# Patient Record
Sex: Female | Born: 1995 | Race: White | Hispanic: No | Marital: Single | Smoking: Never smoker
Health system: Southern US, Community
[De-identification: ages and names within clinical notes are randomized; demographics above are authoritative.]

---

## 2018-12-24 ENCOUNTER — Emergency Department (HOSPITAL_BASED_OUTPATIENT_CLINIC_OR_DEPARTMENT_OTHER): Payer: BLUE CROSS/BLUE SHIELD

## 2018-12-24 ENCOUNTER — Emergency Department (HOSPITAL_BASED_OUTPATIENT_CLINIC_OR_DEPARTMENT_OTHER)
Admission: EM | Admit: 2018-12-24 | Discharge: 2018-12-24 | Disposition: A | Discharge status: Home / Self Care | Payer: BLUE CROSS/BLUE SHIELD | Attending: Emergency Medicine | Admitting: Emergency Medicine

## 2018-12-24 ENCOUNTER — Encounter (HOSPITAL_BASED_OUTPATIENT_CLINIC_OR_DEPARTMENT_OTHER): Payer: Self-pay

## 2018-12-24 ENCOUNTER — Other Ambulatory Visit: Payer: Self-pay

## 2018-12-24 DIAGNOSIS — R109 Unspecified abdominal pain: Secondary | ICD-10-CM | POA: Diagnosis present

## 2018-12-24 DIAGNOSIS — R1031 Right lower quadrant pain: Secondary | ICD-10-CM | POA: Insufficient documentation

## 2018-12-24 DIAGNOSIS — N938 Other specified abnormal uterine and vaginal bleeding: Secondary | ICD-10-CM | POA: Insufficient documentation

## 2018-12-24 LAB — WET PREP, GENITAL
Clue Cells Wet Prep HPF POC: NONE SEEN
Sperm: NONE SEEN
Trich, Wet Prep: NONE SEEN
Yeast Wet Prep HPF POC: NONE SEEN

## 2018-12-24 LAB — COMPREHENSIVE METABOLIC PANEL
ALT: 11 U/L (ref 0–44)
AST: 16 U/L (ref 15–41)
Albumin: 4.3 g/dL (ref 3.5–5.0)
Alkaline Phosphatase: 52 U/L (ref 38–126)
Anion gap: 10 (ref 5–15)
BUN: 17 mg/dL (ref 6–20)
CO2: 21 mmol/L — ABNORMAL LOW (ref 22–32)
Calcium: 9 mg/dL (ref 8.9–10.3)
Chloride: 107 mmol/L (ref 98–111)
Creatinine, Ser: 0.77 mg/dL (ref 0.44–1.00)
GFR calc Af Amer: >60 mL/min (ref >60)
GFR calc non Af Amer: >60 mL/min (ref >60)
Glucose, Bld: 93 mg/dL (ref 70–99)
Potassium: 4.3 mmol/L (ref 3.5–5.1)
Sodium: 138 mmol/L (ref 135–145)
Total Bilirubin: 1.3 mg/dL — ABNORMAL HIGH (ref 0.3–1.2)
Total Protein: 7.3 g/dL (ref 6.5–8.1)

## 2018-12-24 LAB — HIV ANTIBODY (ROUTINE TESTING W REFLEX): HIV Screen 4th Generation wRfx: NONREACTIVE

## 2018-12-24 LAB — URINALYSIS, ROUTINE W REFLEX MICROSCOPIC
Bilirubin Urine: NEGATIVE
Glucose, UA: NEGATIVE mg/dL
Ketones, ur: NEGATIVE mg/dL
Leukocytes,Ua: NEGATIVE
Nitrite: NEGATIVE
Protein, ur: 30 mg/dL — AB
Specific Gravity, Urine: >1.03 — ABNORMAL HIGH (ref 1.005–1.030)
pH: 5.5 (ref 5.0–8.0)

## 2018-12-24 LAB — URINALYSIS, MICROSCOPIC (REFLEX)

## 2018-12-24 LAB — CBC WITH DIFFERENTIAL/PLATELET
Abs Immature Granulocytes: 0.04 10*3/uL (ref 0.00–0.07)
Basophils Absolute: 0 10*3/uL (ref 0.0–0.1)
Basophils Relative: 0 %
Eosinophils Absolute: 0 10*3/uL (ref 0.0–0.5)
Eosinophils Relative: 0 %
HCT: 40.1 % (ref 36.0–46.0)
Hemoglobin: 13.4 g/dL (ref 12.0–15.0)
Immature Granulocytes: 0 %
Lymphocytes Relative: 22 %
Lymphs Abs: 2.1 10*3/uL (ref 0.7–4.0)
MCH: 30.4 pg (ref 26.0–34.0)
MCHC: 33.4 g/dL (ref 30.0–36.0)
MCV: 90.9 fL (ref 80.0–100.0)
Monocytes Absolute: 0.7 10*3/uL (ref 0.1–1.0)
Monocytes Relative: 8 %
Neutro Abs: 6.8 10*3/uL (ref 1.7–7.7)
Neutrophils Relative %: 70 %
Platelets: 306 10*3/uL (ref 150–400)
RBC: 4.41 MIL/uL (ref 3.87–5.11)
RDW: 12.9 % (ref 11.5–15.5)
WBC: 9.8 10*3/uL (ref 4.0–10.5)
nRBC: 0 % (ref 0.0–0.2)

## 2018-12-24 LAB — PREGNANCY, URINE: Preg Test, Ur: NEGATIVE

## 2018-12-24 LAB — LIPASE, BLOOD: Lipase: 39 U/L (ref 11–51)

## 2018-12-24 MED ORDER — IOHEXOL 300 MG/ML  SOLN
100.0000 mL | Freq: Once | INTRAMUSCULAR | Status: AC | PRN
  Administered 2018-12-24: 17:00:00 100 mL via INTRAVENOUS

## 2018-12-24 MED ORDER — SODIUM CHLORIDE 0.9 % IV BOLUS
1000.0000 mL | Freq: Once | INTRAVENOUS | Status: AC
  Administered 2018-12-24: 1000 mL via INTRAVENOUS

## 2018-12-24 NOTE — ED Triage Notes (Signed)
Pt c/o right side abd pain started last night-denies n/v/d, denise urinary sx and vaginal-NAD-steady gait

## 2018-12-24 NOTE — ED Notes (Signed)
Patient transported to Ultrasound 

## 2018-12-24 NOTE — Discharge Instructions (Signed)
Your labwork, ultrasound, and CT scan were all reassuring today We have tested you for gonorrhea, chlamydia, HIV, and syphilis - you will receive a call in 2-3 days IF you test positive. You will not receive a call if you are negative.  Please follow up with OBGYN given abdominal pain and vaginal bleeding - [facility]. They have walk in hours - please call to find out when their walk in hours are You can take 600 mg Ibuprofen every 6-8 hours as needed for your pain

## 2018-12-24 NOTE — ED Provider Notes (Signed)
[facility] DEPARTMENT Provider Note   CSN: [id number] Arrival date & time: 12/24/18  1150     History   Chief Complaint Chief Complaint  Patient presents with   Abdominal Pain    HPI Katherine Estrada is a 23 y.o. female who presents to the ED with mother complaining of gradual onset, intermittent, achy, RLQ abdominal pain that began last night.  Patient reports the pain lasts a few minutes before subsiding on its own.  She also reports that she had her period 2 weeks ago and finished approximately 1 week ago.  She states that she noticed some spotting last night as well which is atypical for her.  Patient has never had pain like this in the past.  She has not taken anything for it as it typically goes away on its own.  He is sexually active with one female partner but reports they always use protection.  Denies fever, chills, nausea, vomiting, diarrhea, pelvic pain, vaginal discharge, dysuria, urinary frequency, urgency, any other associated symptoms.  No previous abdominal surgeries.  No recent antibiotic use.  No suspicious food intake.      History reviewed. No pertinent past medical history.  There are no active problems to display for this patient.   History reviewed. No pertinent surgical history.   OB History    Gravida  0   Para  0   Term  0   Preterm  0   AB  0   Living  0     SAB  0   TAB  0   Ectopic  0   Multiple  0   Live Births  0            Home Medications    Prior to Admission medications   Not on File    Family History No family history on file.  Social History Social History   Tobacco Use   Smoking status: Never Smoker   Smokeless tobacco: Never Used  Substance Use Topics   Alcohol use: Never    Frequency: Never   Drug use: Never     Allergies   Patient has no known allergies.   Review of Systems Review of Systems  Constitutional: Negative for chills and fever.  HENT: Negative for  congestion.   Eyes: Negative for visual disturbance.  Respiratory: Negative for shortness of breath.   Cardiovascular: Negative for chest pain.  Gastrointestinal: Positive for abdominal pain. Negative for diarrhea, nausea and vomiting.  Genitourinary: Positive for menstrual problem. Negative for difficulty urinating, dysuria, flank pain, pelvic pain and vaginal discharge.  Musculoskeletal: Negative for myalgias.  Skin: Negative for rash.  Neurological: Negative for headaches.     Physical Exam Updated Vital Signs BP (!) 119/91 (BP Location: Left Arm)    Pulse 98    Temp 97.7 F (36.5 C) (Oral)    Resp 20    Ht 5\' 6"  (1.676 m)    Wt 44.3 kg    LMP 12/13/2018    SpO2 100%    BMI 15.77 kg/m   Physical Exam Vitals signs and nursing note reviewed.  Constitutional:      Appearance: She is not ill-appearing.  HENT:     Head: Normocephalic and atraumatic.  Eyes:     Conjunctiva/sclera: Conjunctivae normal.  Neck:     Musculoskeletal: Neck supple.  Cardiovascular:     Rate and Rhythm: Normal rate and regular rhythm.  Pulmonary:     Effort: Pulmonary effort is  normal.     Breath sounds: Normal breath sounds. No wheezing, rhonchi or rales.  Abdominal:     Palpations: Abdomen is soft.     Tenderness: There is abdominal tenderness in the right lower quadrant. There is no right CVA tenderness, left CVA tenderness, guarding or rebound. Positive signs include McBurney's sign. Negative signs include Rovsing's sign, psoas sign and obturator sign.  Genitourinary:    Comments: Chaperone present for exam. No rashes, lesions, or tenderness to external genitalia. No erythema, injury, or tenderness to vaginal mucosa. Small amount of blood to vault. No adnexal masses, tenderness, or fullness. No CMT, cervical friability, or discharge from cervical os. Cervical os is closed. Uterus non-deviated, mobile, nonTTP, and without enlargement.  Skin:    General: Skin is warm and dry.  Neurological:      Mental Status: She is alert.      ED Treatments / Results  Labs (all labs ordered are listed, but only abnormal results are displayed) Labs Reviewed  WET PREP, GENITAL - Abnormal; Notable for the following components:      Result Value   WBC, Wet Prep HPF POC MODERATE (*)    All other components within normal limits  COMPREHENSIVE METABOLIC PANEL - Abnormal; Notable for the following components:   CO2 21 (*)    Total Bilirubin 1.3 (*)    All other components within normal limits  URINALYSIS, ROUTINE W REFLEX MICROSCOPIC - Abnormal; Notable for the following components:   APPearance CLOUDY (*)    Specific Gravity, Urine >1.030 (*)    Hgb urine dipstick LARGE (*)    Protein, ur 30 (*)    All other components within normal limits  URINALYSIS, MICROSCOPIC (REFLEX) - Abnormal; Notable for the following components:   Bacteria, UA FEW (*)    All other components within normal limits  LIPASE, BLOOD  CBC WITH DIFFERENTIAL/PLATELET  PREGNANCY, URINE  RPR  HIV ANTIBODY (ROUTINE TESTING W REFLEX)  GC/CHLAMYDIA PROBE AMP ([place]) NOT AT [facility]    EKG None  Radiology Ct Abdomen Pelvis W Contrast  Result Date: 12/24/2018 CLINICAL DATA:  Abdomen pain.  Assess for appendicitis. EXAM: CT ABDOMEN AND PELVIS WITH CONTRAST TECHNIQUE: Multidetector CT imaging of the abdomen and pelvis was performed using the standard protocol following bolus administration of intravenous contrast. CONTRAST:  OMNIPAQUE IOHEXOL 300 MG/ML  SOLN COMPARISON:  None. FINDINGS: Lower chest: No acute abnormality. Hepatobiliary: No focal liver abnormality is seen. No gallstones, gallbladder wall thickening, or biliary dilatation. Pancreas: Unremarkable. No pancreatic ductal dilatation or surrounding inflammatory changes. Spleen: Normal in size without focal abnormality. Adrenals/Urinary Tract: Adrenal glands are unremarkable. Kidneys are normal, without renal calculi, focal lesion, or hydronephrosis. Bladder is  unremarkable. Stomach/Bowel: Stomach is within normal limits. Appendix appears normal. No evidence of bowel wall thickening, distention, or inflammatory changes. Vascular/Lymphatic: No significant vascular findings are present. No enlarged abdominal or pelvic lymph nodes. Reproductive: The uterus is normal. Bilateral ovarian cysts are identified, right greater than left. Small amount of free fluid is identified in the right adnexa. Other: None. Musculoskeletal: No acute abnormality. IMPRESSION: No bowel obstruction.  Normal appendix. Bilateral ovarian cysts, right greater than left. Small amount of free fluid is identified in the right adnexa, physiologic. Electronically Signed   By: Sherian Rein M.D.   On: 12/24/2018 17:34   US Pelvic Complete W Transvaginal And Torsion R/o  Result Date: 12/24/2018 CLINICAL DATA:  Right lower quadrant abdomen pain EXAM: TRANSABDOMINAL AND TRANSVAGINAL ULTRASOUND OF PELVIS DOPPLER  ULTRASOUND OF OVARIES TECHNIQUE: Both transabdominal and transvaginal ultrasound examinations of the pelvis were performed. Transabdominal technique was performed for global imaging of the pelvis including uterus, ovaries, adnexal regions, and pelvic cul-de-sac. It was necessary to proceed with endovaginal exam following the transabdominal exam to visualize the endometrium and bilateral ovaries. Color and duplex Doppler ultrasound was utilized to evaluate blood flow to the ovaries. COMPARISON:  None. FINDINGS: Uterus Measurements: 6.5 x 3.3 x 4.3 cm = volume: 47.2 mL. No fibroids or other mass visualized. Endometrium Thickness: 4.8 mm.  No focal abnormality visualized. Right ovary Measurements: 4.7 x 1.9 x 4.8 cm = volume: 22.2 mL. Normal appearance/no adnexal mass. Left ovary Measurements: 3.5 x 2.2 x 3.6 cm = volume: 14.6 mL. Normal appearance/no adnexal mass. Pulsed Doppler evaluation of both ovaries demonstrates normal low-resistance arterial and venous waveforms. Other findings Trace free fluid  is identified in the pelvis. IMPRESSION: No acute abnormality.  No evidence of ovarian torsion. Trace free fluid is identified in the pelvis which may be physiologic. Electronically Signed   By: Sherian ReinWei-Chen  Lin M.D.   On: 12/24/2018 14:58    Procedures Procedures (including critical care time)  Medications Ordered in ED Medications  sodium chloride 0.9 % bolus 1,000 mL (0 mLs Intravenous Stopped 12/24/18 1609)  iohexol (OMNIPAQUE) 300 MG/ML solution 100 mL (100 mLs Intravenous Contrast Given 12/24/18 1707)     Initial Impression / Assessment and Plan / ED Course  I have reviewed the triage vital signs and the nursing notes.  Pertinent labs & imaging results that were available during my care of the patient were reviewed by me and considered in my medical decision making (see chart for details).    23 year old female who is sexually active with 1 female partner who presents to the ED today with right lower quadrant intermittent abdominal pain since last night.  No nausea, vomiting, diarrhea.  Patient afebrile in the ED without tachycardia or tachypnea.  No previous abdominal surgeries.  There is some concern for appendicitis given intermittent abdominal pain without other constitutional symptoms more concern for ovarian torsion at this time.  Will obtain pelvic ultrasound.  Baseline screening labs also obtained including urine pregnancy test.  If pregnancy test positive will order ultrasound to rule out ectopic pregnancy.  If negative will order Doppler to look for ovarian torsion.  If no acute findings on ultrasound will consider CT abdomen and pelvis given right lower quadrant abdominal pain although patient without any other signs including Rovsing's, psoas, obturator.  She states that she does not need anything for pain currently.  We will continue to monitor.   No Leukocytosis on CBC.  Hemoglobin is stable.  No electrolyte abnormalities.  Lipase negative.  No elevation in LFTs.  Is not pregnant.   Urinalysis does show increased specific gravity with 21-50 red blood cells per high-power field.  Patient appears dehydrated.  Will order 1 L normal saline bolus.   Pelvic exam without any CMT or adnexal tenderness.  Ultrasound negative for torsion.  Considering no findings on ultrasound we will proceed to CT abdomen pelvis today to rule out appendicitis.   CT scan negative for appendicitis. No other acute findings. Pt to be discharged home at this time - advised close follow up with OBGYN. Mom reports that they are in the process of scheduling an appointment with an OBGYN in the area but unsure when they can be seen. Will give information for [facility] healthcare to follow up with as well. Strict return  precautions have been discussed. Advised 600 mg Ibuprofen PRN for pain. Pt is in agreement with plan at this time and stable for discharge home.   This note was prepared using Dragon voice recognition software and may include unintentional dictation errors due to the inherent limitations of voice recognition software.       Final Clinical Impressions(s) / ED Diagnoses   Final diagnoses:  Right lower quadrant abdominal pain  Dysfunctional uterine bleeding    ED Discharge Orders    None       Tanda Rockers, PA-C 12/24/18 1753    Vanetta Mulders, MD 01/02/19 [phone]

## 2018-12-25 LAB — RPR: RPR Ser Ql: NONREACTIVE

## 2018-12-26 LAB — GC/CHLAMYDIA PROBE AMP (~~LOC~~) NOT AT ARMC
Chlamydia: NEGATIVE
Neisseria Gonorrhea: NEGATIVE

## 2021-06-09 ENCOUNTER — Emergency Department (HOSPITAL_BASED_OUTPATIENT_CLINIC_OR_DEPARTMENT_OTHER): Payer: BLUE CROSS/BLUE SHIELD

## 2021-06-09 ENCOUNTER — Emergency Department (HOSPITAL_BASED_OUTPATIENT_CLINIC_OR_DEPARTMENT_OTHER)
Admission: EM | Admit: 2021-06-09 | Discharge: 2021-06-09 | Disposition: A | Discharge status: Home / Self Care | Payer: BLUE CROSS/BLUE SHIELD | Attending: Emergency Medicine | Admitting: Emergency Medicine

## 2021-06-09 ENCOUNTER — Other Ambulatory Visit: Payer: Self-pay

## 2021-06-09 DIAGNOSIS — R29810 Facial weakness: Secondary | ICD-10-CM | POA: Insufficient documentation

## 2021-06-09 DIAGNOSIS — R200 Anesthesia of skin: Secondary | ICD-10-CM

## 2021-06-09 LAB — COMPREHENSIVE METABOLIC PANEL
ALT: 9 U/L (ref 0–44)
AST: 12 U/L — ABNORMAL LOW (ref 15–41)
Albumin: 4.4 g/dL (ref 3.5–5.0)
Alkaline Phosphatase: 32 U/L — ABNORMAL LOW (ref 38–126)
Anion gap: 8 (ref 5–15)
BUN: 10 mg/dL (ref 6–20)
CO2: 26 mmol/L (ref 22–32)
Calcium: 9.7 mg/dL (ref 8.9–10.3)
Chloride: 105 mmol/L (ref 98–111)
Creatinine, Ser: 0.73 mg/dL (ref 0.44–1.00)
GFR, Estimated: >60 mL/min (ref >60)
Glucose, Bld: 98 mg/dL (ref 70–99)
Potassium: 4.3 mmol/L (ref 3.5–5.1)
Sodium: 139 mmol/L (ref 135–145)
Total Bilirubin: 0.5 mg/dL (ref 0.3–1.2)
Total Protein: 7.2 g/dL (ref 6.5–8.1)

## 2021-06-09 LAB — CBC WITH DIFFERENTIAL/PLATELET
Abs Immature Granulocytes: 0.03 10*3/uL (ref 0.00–0.07)
Basophils Absolute: 0 10*3/uL (ref 0.0–0.1)
Basophils Relative: 0 %
Eosinophils Absolute: 0.1 10*3/uL (ref 0.0–0.5)
Eosinophils Relative: 1 %
HCT: 36.1 % (ref 36.0–46.0)
Hemoglobin: 12.5 g/dL (ref 12.0–15.0)
Immature Granulocytes: 0 %
Lymphocytes Relative: 30 %
Lymphs Abs: 2.9 10*3/uL (ref 0.7–4.0)
MCH: 30.6 pg (ref 26.0–34.0)
MCHC: 34.6 g/dL (ref 30.0–36.0)
MCV: 88.3 fL (ref 80.0–100.0)
Monocytes Absolute: 0.7 10*3/uL (ref 0.1–1.0)
Monocytes Relative: 7 %
Neutro Abs: 5.8 10*3/uL (ref 1.7–7.7)
Neutrophils Relative %: 62 %
Platelets: 292 10*3/uL (ref 150–400)
RBC: 4.09 MIL/uL (ref 3.87–5.11)
RDW: 12.3 % (ref 11.5–15.5)
WBC: 9.5 10*3/uL (ref 4.0–10.5)
nRBC: 0 % (ref 0.0–0.2)

## 2021-06-09 MED ORDER — PREDNISONE 20 MG PO TABS: ORAL_TABLET | ORAL | 0 refills | Status: DC

## 2021-06-09 MED ORDER — PREDNISONE 50 MG PO TABS
60.0000 mg | ORAL_TABLET | Freq: Once | ORAL | Status: AC
  Administered 2021-06-09: 60 mg via ORAL
  Filled 2021-06-09: qty 1

## 2021-06-09 NOTE — ED Provider Notes (Signed)
?[facility] EMERGENC[facility] DEPT ?Provider Note ? ? ?CSN: [id number] ?Arrival date & time: 06/09/21  1840 ? ?  ? ?History ? ?Chief Complaint  ?Patient presents with  ? Numbness  ?  L Face, Neck  ? ? ?Katherine Estrada is a 26 y.o. female otherwise healthy here presenting with left facial numbness.  Patient noticed decreased sensation in her left face that goes down to the left side of her neck since yesterday.  Denies any blurry vision or trouble speaking or focal weakness.  Patient denies any arm or leg numbness or weakness.  Denies any medical problems.  ? ?The history is provided by the patient.  ? ?  ? ?Home Medications ?Prior to Admission medications   ?Not on File  ?   ? ?Allergies    ?Patient has no known allergies.   ? ?Review of Systems   ?Review of Systems  ?Neurological:  Positive for numbness.  ?All other systems reviewed and are negative. ? ?Physical Exam ?Updated Vital Signs ?BP 128/81 (BP Location: Right Arm)   Pulse 75   Temp 98.1 ?F (36.7 ?C) (Oral)   Resp 18   Ht 5\' 5"  (1.651 m)   Wt 45.4 kg   SpO2 100%   BMI 16.64 kg/m?  ?Physical Exam ?Vitals and nursing note reviewed.  ?Constitutional:   ?   Appearance: Normal appearance.  ?HENT:  ?   Head: Normocephalic.  ?   Nose: Nose normal.  ?   Mouth/Throat:  ?   Mouth: Mucous membranes are moist.  ?Eyes:  ?   Extraocular Movements: Extraocular movements intact.  ?   Pupils: Pupils are equal, round, and reactive to light.  ?Cardiovascular:  ?   Rate and Rhythm: Normal rate and regular rhythm.  ?   Pulses: Normal pulses.  ?   Heart sounds: Normal heart sounds.  ?Pulmonary:  ?   Effort: Pulmonary effort is normal.  ?   Breath sounds: Normal breath sounds.  ?Abdominal:  ?   General: Abdomen is flat.  ?   Palpations: Abdomen is soft.  ?Musculoskeletal:     ?   General: Normal range of motion.  ?   Cervical back: Normal range of motion and neck supple.  ?Skin: ?   General: Skin is warm.  ?   Capillary Refill: Capillary refill takes less than 2  seconds.  ?Neurological:  ?   Mental Status: She is alert.  ?   Comments: Decreased sensation of the entire left face.  Patient has no obvious facial droop.  Patient is able to open and close her eyes.  Patient has no numbness of the arms and legs.  Patient has normal finger-to-nose bilaterally and negative Romberg sign.  Normal strength upper and lower extremities.  Patient has normal gait   ?Psychiatric:     ?   Mood and Affect: Mood normal.     ?   Behavior: Behavior normal.  ? ? ?ED Results / Procedures / Treatments   ?Labs ?(all labs ordered are listed, but only abnormal results are displayed) ?Labs Reviewed  ?COMPREHENSIVE METABOLIC PANEL - Abnormal; Notable for the following components:  ?    Result Value  ? AST 12 (*)   ? Alkaline Phosphatase 32 (*)   ? All other components within normal limits  ?CBC WITH DIFFERENTIAL/PLATELET  ? ? ?EKG ?None ? ?Radiology ?CT Head Wo Contrast ? ?Result Date: 06/09/2021 ?CLINICAL DATA:  Persistent left-sided facial numbness. EXAM: CT HEAD WITHOUT CONTRAST TECHNIQUE: Contiguous axial  images were obtained from the base of the skull through the vertex without intravenous contrast. RADIATION DOSE REDUCTION: This exam was performed according to the departmental dose-optimization program which includes automated exposure control, adjustment of the mA and/or kV according to patient size and/or use of iterative reconstruction technique. COMPARISON:  None. FINDINGS: Brain: No evidence of acute infarction, hemorrhage, hydrocephalus, extra-axial collection or mass lesion/mass effect. Vascular: No hyperdense vessel or unexpected calcification. Skull: Normal. Negative for fracture or focal lesion. Sinuses/Orbits: No acute finding. Other: None. IMPRESSION: No acute intracranial pathology. Electronically Signed   By: Aram Candela M.D.   On: 06/09/2021 22:31   ? ?Procedures ?Procedures  ? ? ?Medications Ordered in ED ?Medications  ?predniSONE (DELTASONE) tablet 60 mg (60 mg Oral Given  06/09/21 2248)  ? ? ?ED Course/ Medical Decision Making/ A&P ?  ?                        ?Medical Decision Making ?Katherine Estrada is a 26 y.o. female here with left facial numbness.  I think likely early Bell's palsy.  Patient does not have any obvious facial droop and no obvious facial muscle weakness.  Also consider electrolyte abnormalities.  Finally, we will get a CT head to rule out brain mass. ? ?11:01 PM ?Labs unremarkable.  CT head showed no mass.  Will give prednisone empirically for early Bell's palsy.  Will refer to neurology outpatient ?  ? ?Problems Addressed: ?Left facial numbness: acute illness or injury ? ?Amount and/or Complexity of Data Reviewed ?Labs: ordered. Decision-making details documented in ED Course. ?Radiology: ordered and independent interpretation performed. Decision-making details documented in ED Course. ? ? ?Final Clinical Impression(s) / ED Diagnoses ?Final diagnoses:  ?None  ? ? ?Rx / DC Orders ?ED Discharge Orders   ? ? None  ? ?  ? ? ?  ?Charlynne Pander, MD ?06/09/21 2302 ? ?

## 2021-06-09 NOTE — ED Triage Notes (Signed)
Pt via POV, c/o persistent numbness to left face and neck onset yesterday 4/12 approx 7 pm.Pt ambulatory in triage. No weakness, face symmetrical, dizziness, vision changes and no other neuro deficit.  ?

## 2021-06-09 NOTE — Discharge Instructions (Addendum)
You likely have a early Bell's palsy.  Your labs and CT scans were normal today ? ?Please take prednisone as prescribed ? ?If after you finish the prednisone, you still have facial numbness please call neurology for follow-up ? ?Return to ER if you have worse numbness, weakness, trouble speaking ? ?

## 2021-06-30 ENCOUNTER — Encounter: Payer: Self-pay | Admitting: Neurology

## 2021-06-30 ENCOUNTER — Ambulatory Visit (INDEPENDENT_AMBULATORY_CARE_PROVIDER_SITE_OTHER): Payer: No Typology Code available for payment source | Admitting: Neurology

## 2021-06-30 VITALS — BP 111/70 | HR 82 | Ht 66.0 in | Wt 101.0 lb

## 2021-06-30 DIAGNOSIS — R200 Anesthesia of skin: Secondary | ICD-10-CM | POA: Diagnosis not present

## 2021-06-30 NOTE — Patient Instructions (Signed)
Continue with your PMD  ?Return as needed  ?

## 2021-06-30 NOTE — Progress Notes (Signed)
? ?[facility] ? ?PATIENT: Katherine Estrada ?DOB: Oct 13, 1995 ? ?REQUESTING CLINICIAN: Charlynne Pander, MD ?HISTORY FROM: Patient  ?REASON FOR VISIT: Left facial numbness  ? ? ?HISTORICAL ? ?CHIEF COMPLAINT:  ?Chief Complaint  ?Patient presents with  ? New Patient (Initial Visit)  ?  Rm 12. ?NP/internal ED referral for left facial numbness, early Bell's Palsy.  ? ? ?HISTORY OF PRESENT ILLNESS:  ?This is a 26 year old woman with no reported past medical history who is presenting with 3 weeks history of facial numbness.  Patient presented to the ED on April 13 after a 2-day history of left facial numbness.  The numbness involves the left forehead and the left maxillary area, lower chin and lower jaw is spared.  She had a head CT which was negative, diagnosed with early Bell's palsy and started on prednisone for 7 days.  Patient report completing the treatment, and has improvement of the numbness.  States the numbness is still present but improved.  No other symptoms, denies any facial weakness, denies any facial pain except mild left ear pain intermittent, denies any change in her taste and no other complaints. She denies any trauma and no provoking factors.  ? ? ?OTHER MEDICAL CONDITIONS: None  ? ? ?REVIEW OF SYSTEMS: Full 14 system review of systems performed and negative with exception of: as noted in the HPI  ? ?ALLERGIES: ?No Known Allergies ? ?HOME MEDICATIONS: ?Outpatient Medications Prior to Visit  ?Medication Sig Dispense Refill  ? predniSONE (DELTASONE) 20 MG tablet Take 60 mg daily x 2 days then 40 mg daily x 2 days then 20 mg daily x 2 days 12 tablet 0  ? ?No facility-administered medications prior to visit.  ? ? ?PAST MEDICAL HISTORY: ?History reviewed. No pertinent past medical history. ? ?PAST SURGICAL HISTORY: ?History reviewed. No pertinent surgical history. ? ?FAMILY HISTORY: ?History reviewed. No pertinent family history. ? ?SOCIAL HISTORY: ?Social History  ? ?Socioeconomic  History  ? Marital status: Single  ?  Spouse name: Not on file  ? Number of children: Not on file  ? Years of education: Not on file  ? Highest education level: Not on file  ?Occupational History  ? Not on file  ?Tobacco Use  ? Smoking status: Never  ? Smokeless tobacco: Never  ?Vaping Use  ? Vaping Use: Never used  ?Substance and Sexual Activity  ? Alcohol use: Never  ? Drug use: Never  ? Sexual activity: Yes  ?  Partners: Male  ?  Birth control/protection: None, Condom  ?Other Topics Concern  ? Not on file  ?Social History Narrative  ? Not on file  ? ?Social Determinants of Health  ? ?Financial Resource Strain: Not on file  ?Food Insecurity: Not on file  ?Transportation Needs: Not on file  ?Physical Activity: Not on file  ?Stress: Not on file  ?Social Connections: Not on file  ?Intimate Partner Violence: Not on file  ? ? ?PHYSICAL EXAM ? ?GENERAL EXAM/CONSTITUTIONAL: ?Vitals:  ?Vitals:  ? 06/30/21 1311  ?BP: 111/70  ?Pulse: 82  ?Weight: 101 lb (45.8 kg)  ?Height: 5\' 6"  (1.676 m)  ? ?Body mass index is 16.3 kg/m?. ?Wt Readings from Last 3 Encounters:  ?06/30/21 101 lb (45.8 kg)  ?06/09/21 100 lb (45.4 kg)  ?12/24/18 97 lb 11.2 oz (44.3 kg)  ? ?Patient is in no distress; well developed, nourished and groomed; neck is supple ? ?EYES: ?Pupils round and reactive to light, Visual fields full to confrontation, Extraocular movements intacts,  ? ?  MUSCULOSKELETAL: ?Gait, strength, tone, movements noted in Neurologic exam below ? ?NEUROLOGIC: ?MENTAL STATUS:  ?   ? View : No data to display.  ?  ?  ?  ? ?awake, alert, oriented to person, place and time ?recent and remote memory intact ?normal attention and concentration ?language fluent, comprehension intact, naming intact ?fund of knowledge appropriate ? ?CRANIAL NERVE:  ?2nd, 3rd, 4th, 6th - pupils equal and reactive to light, visual fields full to confrontation, extraocular muscles intact, no nystagmus ?5th - Decrease sensation to forehead and left maxillary area ?7th -  facial strength symmetric ?8th - hearing intact ?9th - palate elevates symmetrically, uvula midline ?11th - shoulder shrug symmetric ?12th - tongue protrusion midline ? ?MOTOR:  ?normal bulk and tone, full strength in the BUE, BLE ? ?SENSORY:  ?normal and symmetric to light touch, pinprick, temperature, vibration ? ?COORDINATION:  ?finger-nose-finger, fine finger movements normal ? ?REFLEXES:  ?deep tendon reflexes present and symmetric ? ?GAIT/STATION:  ?normal ? ? ? ?DIAGNOSTIC DATA (LABS, IMAGING, TESTING) ?- I reviewed patient records, labs, notes, testing and imaging myself where available. ? ?Lab Results  ?Component Value Date  ? WBC 9.5 06/09/2021  ? HGB 12.5 06/09/2021  ? HCT 36.1 06/09/2021  ? MCV 88.3 06/09/2021  ? PLT 292 06/09/2021  ? ?   ?Component Value Date/Time  ? NA 139 06/09/2021 2117  ? K 4.3 06/09/2021 2117  ? CL 105 06/09/2021 2117  ? CO2 26 06/09/2021 2117  ? GLUCOSE 98 06/09/2021 2117  ? BUN 10 06/09/2021 2117  ? CREATININE 0.73 06/09/2021 2117  ? CALCIUM 9.7 06/09/2021 2117  ? PROT 7.2 06/09/2021 2117  ? ALBUMIN 4.4 06/09/2021 2117  ? AST 12 (L) 06/09/2021 2117  ? ALT 9 06/09/2021 2117  ? ALKPHOS 32 (L) 06/09/2021 2117  ? BILITOT 0.5 06/09/2021 2117  ? GFRNONAA >60 06/09/2021 2117  ? GFRAA >60 12/24/2018 1252  ? ?No results found for: CHOL, HDL, LDLCALC, LDLDIRECT, TRIG, CHOLHDL ?No results found for: HGBA1C ?No results found for: VITAMINB12 ?No results found for: TSH ? ? ?Head CT 06/09/21 ?No acute intracranial pathology. ? ? ? ?ASSESSMENT AND PLAN ? ?26 y.o. year old female with no reported past medical history who is presenting with 3 weeks history of left forehead and left maxillary area numbness which is now improving.  Patient denies any weakness of the face, denies any change in her taste or speech.  She denies any inciting factors, no trauma.  This presentation is not typical for Bell's palsy as there is no weakness but there is  presence of sensory changes.  I have informed patient  that we will continue to monitor her symptoms.  If they get worse or she has any new symptoms to come to the office as soon as possible, otherwise continue to follow with your primary care doctor ? ? ?1. Left facial numbness   ? ? ? ?Patient Instructions  ?Continue with your PMD  ?Return as needed  ? ?No orders of the defined types were placed in this encounter. ? ? ?No orders of the defined types were placed in this encounter. ? ? ?Return if symptoms worsen or fail to improve. ? ?I have spent a total of 30 minutes dedicated to this patient today, preparing to see patient, performing a medically appropriate examination and evaluation, ordering tests and/or medications and procedures, and counseling and educating the patient/family/caregiver; independently interpreting result and communicating results to the family/patient/caregiver; and documenting clinical information in the electronic  medical record. ? ? ?Windell NorfolkAmadou Hser Belanger, MD 06/30/2021, 1:37 PM ? ?[facility] ?[address], [address] ?[place], [place] [address] ?[phone] ? ?

## 2022-03-13 ENCOUNTER — Telehealth: Payer: Self-pay | Admitting: Neurology

## 2022-03-13 NOTE — Telephone Encounter (Signed)
LVM and sent mychart msg informing pt of need to reschedule 06/12/22 appointment - MD out

## 2022-06-12 ENCOUNTER — Ambulatory Visit: Payer: No Typology Code available for payment source | Admitting: Neurology

## 2022-06-19 ENCOUNTER — Ambulatory Visit: Payer: No Typology Code available for payment source | Admitting: Neurology

## 2022-08-23 ENCOUNTER — Ambulatory Visit: Payer: No Typology Code available for payment source | Admitting: Neurology

## 2022-11-09 NOTE — Progress Notes (Signed)
Cardiology Office Note:  .   Date:  11/09/2022  ID:  Katherine Estrada, DOB 1995-10-28, MRN [id number] PCP: No primary care provider on file.  [facility] Providers Cardiologist:  None    History of Present Illness: Katherine Estrada is a 27 y.o. female here for palpitations.  She presents with skipped heartbeats for the past two months. The sensation of skipped beats occurs mostly when standing and is experienced daily, once or twice. She denies any episodes of heart racing, syncope, or pre-syncope. She consumes caffeine but denies excessive intake or consumption of energy drinks. No family hx of SCD. Grandfather with MI in 60s  ROS:  per HPI otherwise negative   Studies Reviewed: Marland Kitchen        EKG Interpretation Date/Time:  Friday November 10 2022 08:17:21 EDT Ventricular Rate:  90 PR Interval:  172 QRS Duration:  76 QT Interval:  326 QTC Calculation: 398 R Axis:   -27  Text Interpretation: Normal sinus rhythm Normal ECG No previous ECGs available Confirmed by Carolan Clines ([id number]) on 11/10/2022 8:21:22 AM  Risk Assessment/Calculations:        Physical Exam:   VS:   Vitals:   11/10/22 0817  BP: 110/82  Pulse: 90  SpO2: 99%    Wt Readings from Last 3 Encounters:  06/30/21 101 lb (45.8 kg)  06/09/21 100 lb (45.4 kg)  12/24/18 97 lb 11.2 oz (44.3 kg)    GEN: Well nourished, well developed in no acute distress NECK: No JVD; No carotid bruits CARDIAC: RRR, no murmurs, rubs, gallops RESPIRATORY:  Clear to auscultation without rales, wheezing or rhonchi  ABDOMEN: Soft, non-tender, non-distended EXTREMITIES:  No edema; No deformity   ASSESSMENT AND PLAN: .   Palpitations -She does not have high risk features including syncope c/f arrhythmia , family hx of SCD, or abnormalities on her EKG. -recommend cutting caffeine - no plans for further w/u at this time  Dispo:  FU PRN  Signed, Maisie Fus, MD

## 2022-11-10 ENCOUNTER — Encounter: Payer: Self-pay | Admitting: Internal Medicine

## 2022-11-10 ENCOUNTER — Ambulatory Visit: Payer: BC Managed Care – PPO | Attending: Internal Medicine | Admitting: Internal Medicine

## 2022-11-10 VITALS — BP 110/82 | HR 90 | Ht 65.0 in | Wt 103.2 lb

## 2022-11-10 DIAGNOSIS — Z136 Encounter for screening for cardiovascular disorders: Secondary | ICD-10-CM

## 2022-11-10 NOTE — Patient Instructions (Signed)
Medication Instructions:  Your physician recommends that you continue on your current medications as directed. Please refer to the Current Medication list given to you today.  *If you need a refill on your cardiac medications before your next appointment, please call your pharmacy*   Lab Work: NONE If you have labs (blood work) drawn today and your tests are completely normal, you will receive your results only by: MyChart Message (if you have MyChart) OR A paper copy in the mail If you have any lab test that is abnormal or we need to change your treatment, we will call you to review the results.   Testing/Procedures: NONE   Follow-Up: At [facility], you and your health needs are our priority.  As part of our continuing mission to provide you with exceptional heart care, we have created designated Provider Care Teams.  These Care Teams include your primary Cardiologist (physician) and Advanced Practice Providers (APPs -  Physician Assistants and Nurse Practitioners) who all work together to provide you with the care you need, when you need it.  We recommend signing up for the patient portal called "MyChart".  Sign up information is provided on this After Visit Summary.  MyChart is used to connect with patients for Virtual Visits (Telemedicine).  Patients are able to view lab/test results, encounter notes, upcoming appointments, etc.  Non-urgent messages can be sent to your provider as well.   To learn more about what you can do with MyChart, go to [email or web address].    Your next appointment:   AS NEEDED

## 2022-12-13 DIAGNOSIS — J069 Acute upper respiratory infection, unspecified: Secondary | ICD-10-CM | POA: Diagnosis not present

## 2022-12-26 ENCOUNTER — Ambulatory Visit: Payer: BC Managed Care – PPO | Admitting: Neurology

## 2023-01-20 DIAGNOSIS — N390 Urinary tract infection, site not specified: Secondary | ICD-10-CM | POA: Diagnosis not present

## 2023-01-20 DIAGNOSIS — R350 Frequency of micturition: Secondary | ICD-10-CM | POA: Diagnosis not present

## 2023-05-29 DIAGNOSIS — R350 Frequency of micturition: Secondary | ICD-10-CM | POA: Diagnosis not present

## 2023-05-29 DIAGNOSIS — R300 Dysuria: Secondary | ICD-10-CM | POA: Diagnosis not present

## 2023-06-08 DIAGNOSIS — J020 Streptococcal pharyngitis: Secondary | ICD-10-CM | POA: Diagnosis not present

## 2023-06-08 DIAGNOSIS — H9203 Otalgia, bilateral: Secondary | ICD-10-CM | POA: Diagnosis not present

## 2023-06-25 DIAGNOSIS — N390 Urinary tract infection, site not specified: Secondary | ICD-10-CM | POA: Diagnosis not present

## 2023-07-29 DIAGNOSIS — M545 Low back pain, unspecified: Secondary | ICD-10-CM | POA: Diagnosis not present

## 2023-07-29 DIAGNOSIS — R102 Pelvic and perineal pain: Secondary | ICD-10-CM | POA: Diagnosis not present

## 2023-07-29 DIAGNOSIS — N83202 Unspecified ovarian cyst, left side: Secondary | ICD-10-CM | POA: Diagnosis not present

## 2023-07-29 DIAGNOSIS — Z87440 Personal history of urinary (tract) infections: Secondary | ICD-10-CM | POA: Diagnosis not present

## 2023-07-29 DIAGNOSIS — R300 Dysuria: Secondary | ICD-10-CM | POA: Diagnosis not present

## 2023-07-29 DIAGNOSIS — N898 Other specified noninflammatory disorders of vagina: Secondary | ICD-10-CM | POA: Diagnosis not present

## 2023-07-29 DIAGNOSIS — N83201 Unspecified ovarian cyst, right side: Secondary | ICD-10-CM | POA: Diagnosis not present

## 2023-07-29 DIAGNOSIS — N83291 Other ovarian cyst, right side: Secondary | ICD-10-CM | POA: Diagnosis not present

## 2023-07-29 DIAGNOSIS — N83292 Other ovarian cyst, left side: Secondary | ICD-10-CM | POA: Diagnosis not present

## 2023-08-03 IMAGING — CT CT HEAD W/O CM
4 series · 17 of 47 positions shown, 19 images · non-contrast
Comparison: None.

CLINICAL DATA: Persistent left-sided facial numbness.



[Series 2: head wo · axial · 0.42mm/px · z∈[+906,+1026]mm · 7 of 34 slices shown, 9 images]
[im 5/34  brain]
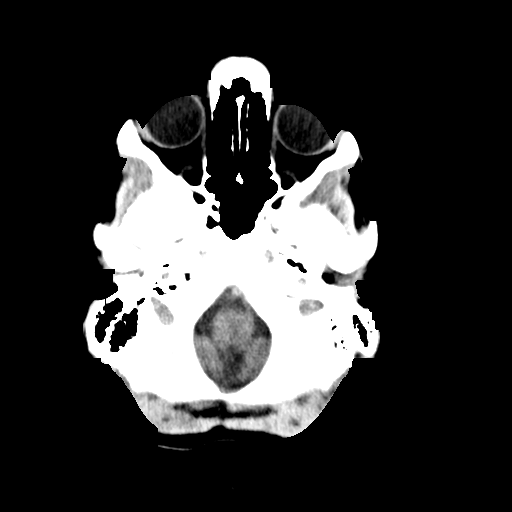
[im 5/34  bone]
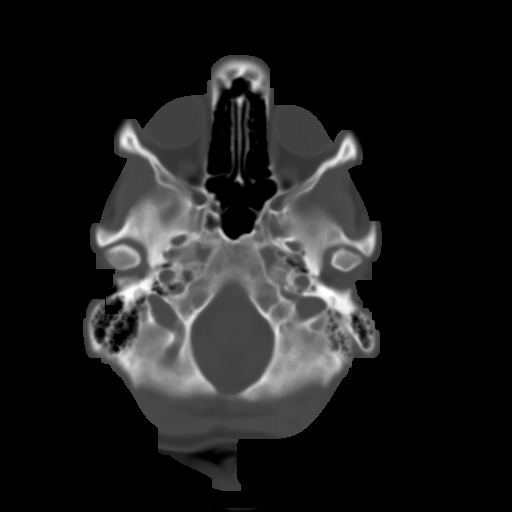
[im 9/34  brain]
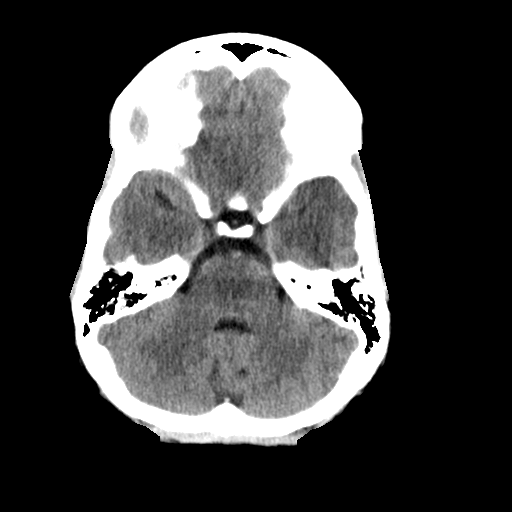
[im 13/34  brain]
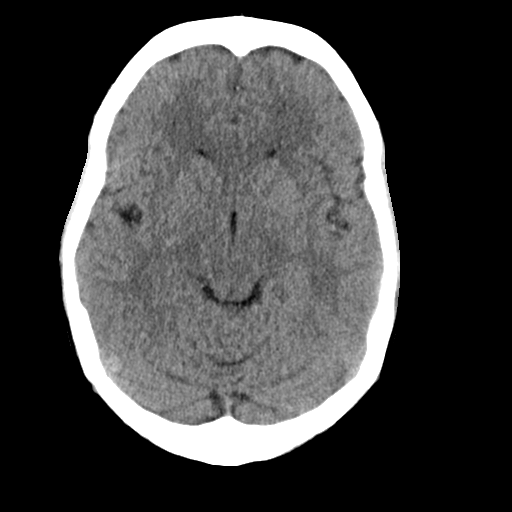
[im 17/34  brain]
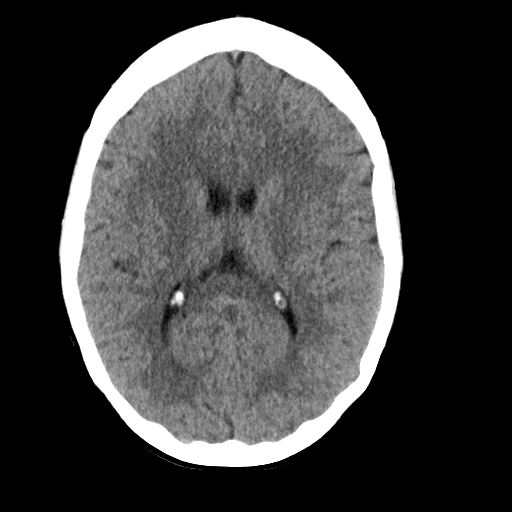
[im 21/34  brain]
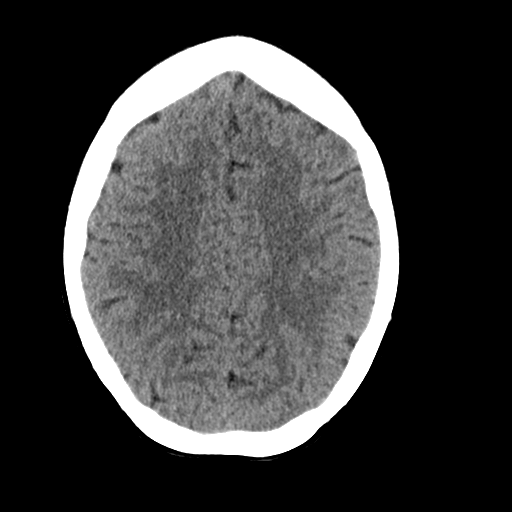
[im 21/34  bone]
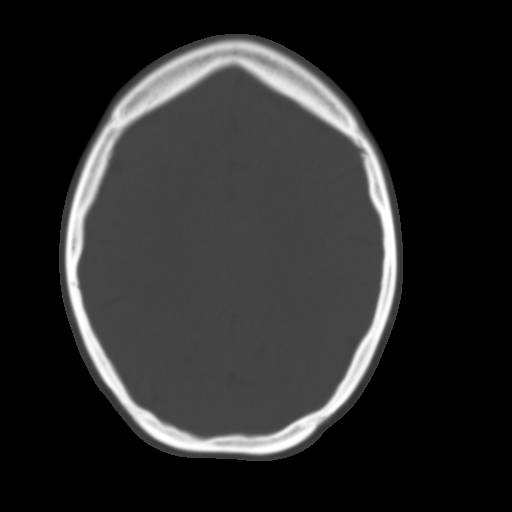
[im 25/34  brain]
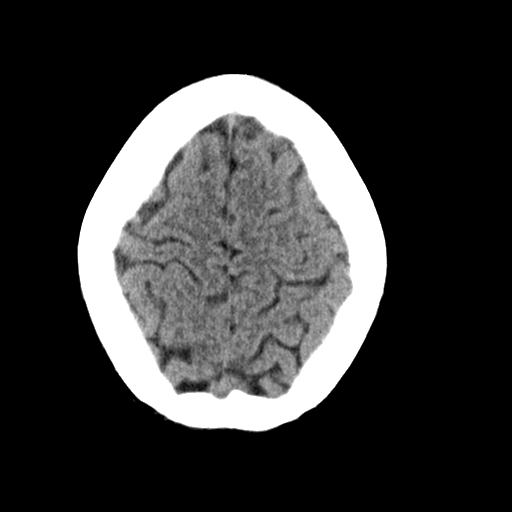
[im 29/34  brain]
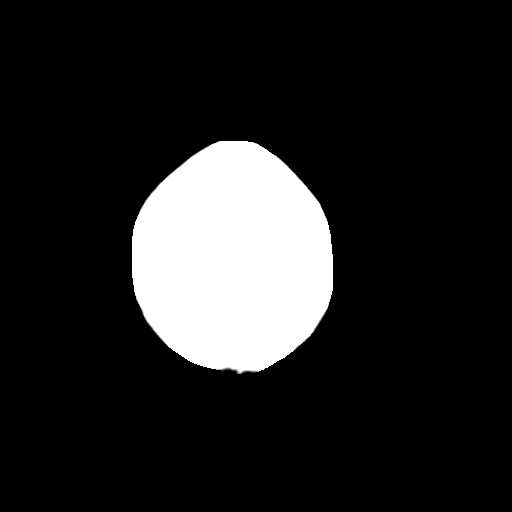

[Series 3: head bone · axial · 0.42mm/px · z∈[+902,+958]mm · 4 of 83 slices shown]
[im 9/83  bone]
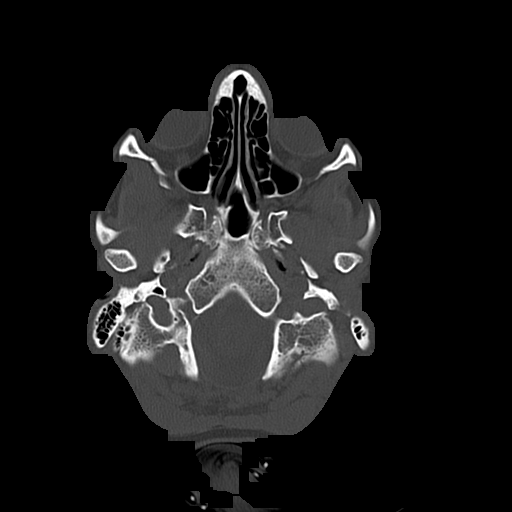
[im 17/83  bone]
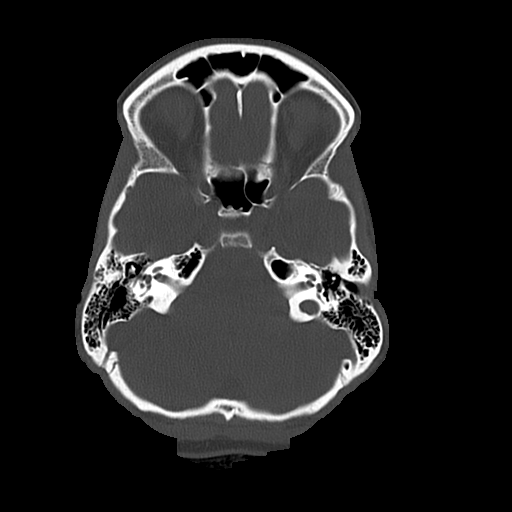
[im 25/83  bone]
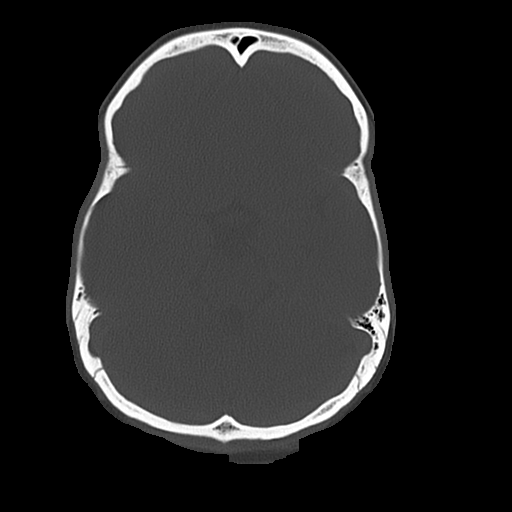
[im 37/83  bone]
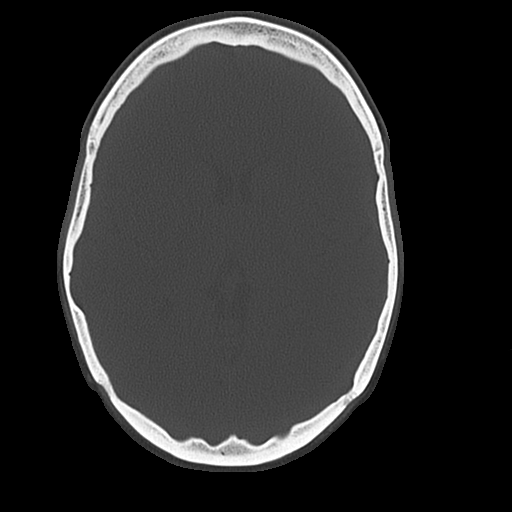

[Series 4: coronal soft · coronal · 0.32mm/px · 3 of 71 slices shown]
[im 24/71  brain]
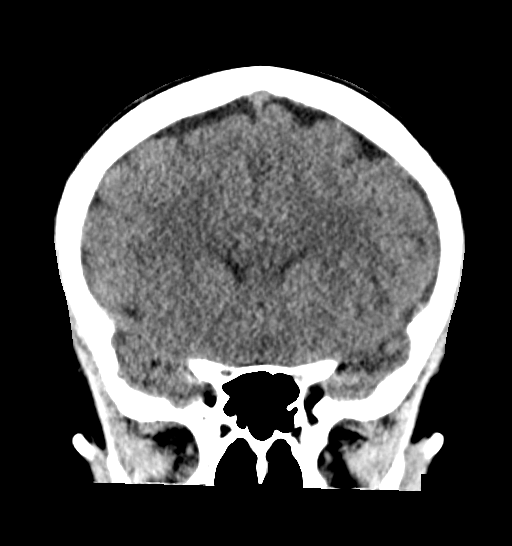
[im 32/71  brain]
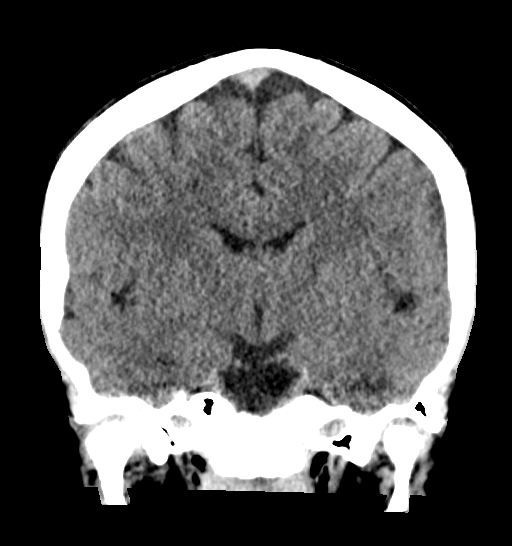
[im 39/71  brain]
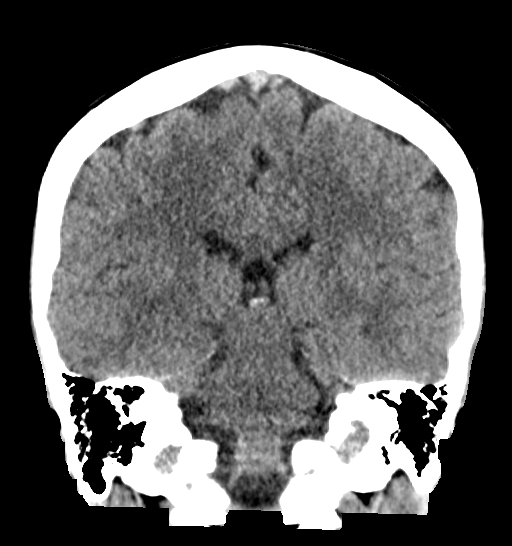

[Series 5: sagittal soft · sagittal · 0.34mm/px · 3 of 55 slices shown]
[im 19/55  brain]
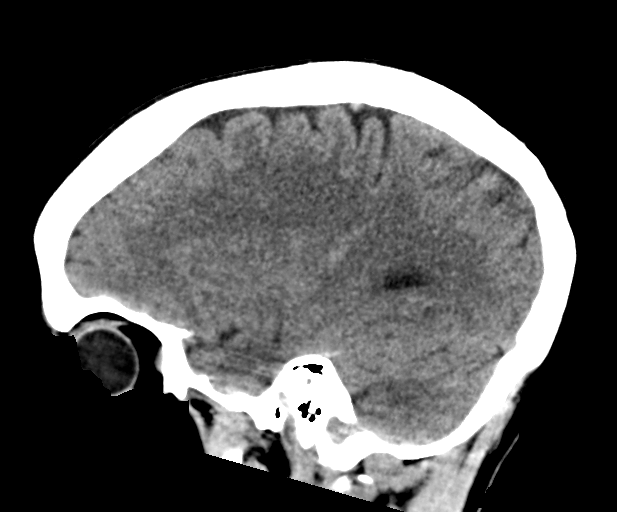
[im 28/55  brain]
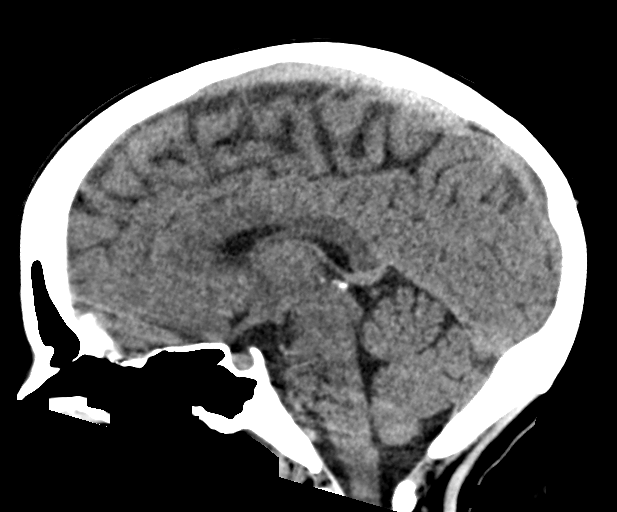
[im 37/55  brain]
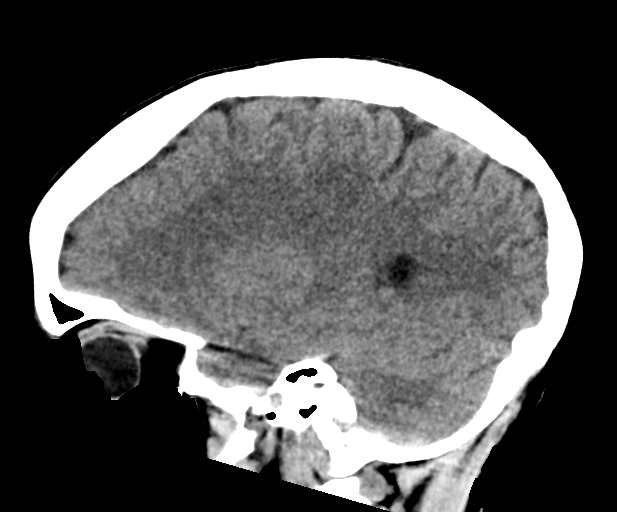

[17 of 47 positions shown; findings below may reference images not displayed]

FINDINGS: Brain: No evidence of acute infarction, hemorrhage, hydrocephalus,
extra-axial collection or mass lesion/mass effect.

Vascular: No hyperdense vessel or unexpected calcification.

Skull: Normal. Negative for fracture or focal lesion.

Sinuses/Orbits: No acute finding.

Other: None.
IMPRESSION: No acute intracranial pathology.

## 2023-08-19 DIAGNOSIS — R21 Rash and other nonspecific skin eruption: Secondary | ICD-10-CM | POA: Diagnosis not present

## 2023-08-19 DIAGNOSIS — L209 Atopic dermatitis, unspecified: Secondary | ICD-10-CM | POA: Diagnosis not present

## 2023-10-10 DIAGNOSIS — M5416 Radiculopathy, lumbar region: Secondary | ICD-10-CM | POA: Diagnosis not present

## 2023-11-06 DIAGNOSIS — R519 Headache, unspecified: Secondary | ICD-10-CM | POA: Diagnosis not present

## 2023-11-06 DIAGNOSIS — Z03818 Encounter for observation for suspected exposure to other biological agents ruled out: Secondary | ICD-10-CM | POA: Diagnosis not present

## 2023-11-06 DIAGNOSIS — J029 Acute pharyngitis, unspecified: Secondary | ICD-10-CM | POA: Diagnosis not present

## 2024-02-27 DIAGNOSIS — R002 Palpitations: Secondary | ICD-10-CM | POA: Diagnosis not present

## 2024-03-04 NOTE — Progress Notes (Unsigned)
 " Cardiology Office Note:  .   Date:  03/05/2024  ID:  Katherine Estrada, DOB 1996-01-01, MRN [id number] PCP: Stamey, Chiquita POUR, FNP  [facility] Providers Cardiologist:  None   History of Present Illness: .    Chief Complaint  Patient presents with   Palpitations    Katherine Estrada is a 29 y.o. female with below history who presents for the evaluation of palpitations at the request of Stamey, Chiquita POUR, FNP.   History of Present Illness   Katherine Estrada is a 29 year old female who presents with palpitations.  She has been experiencing episodes of heart racing for the past month, describing these episodes as her heart beating fast. These episodes have occurred approximately three times last month, both while sitting and walking, and last about three seconds each time. They resolve quickly on their own.  During these episodes, she experiences mild shortness of breath but no chest pain. She has reduced her intake of caffeinated beverages, which she feels has helped with the symptoms. She ensures adequate hydration by drinking plenty of water.  Her past medical history is unremarkable, with no known medical problems or previous surgeries.  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 She does not smoke, uses alcohol occasionally, and denies any drug use. She does not engage in regular exercise. No family history of cardiac issues.        ROS: All other ROS reviewed and negative. Pertinent positives noted in the HPI.     Studies Reviewed: SABRA   EKG Interpretation Date/Time:  Wednesday March 05 2024 14:21:20 EST Ventricular Rate:  81 PR Interval:  166 QRS Duration:  76 QT Interval:  348 QTC Calculation: 404 R Axis:   200  Text Interpretation: Normal sinus rhythm Possible Left atrial enlargement Suspect arm lead reversal, interpretation assumes no reversal Confirmed by Barbaraann Kotyk [phone]) on 03/05/2024  2:23:32 PM   Physical Exam:   VS:  BP 100/62   Pulse 81   Ht 5' 5 (1.651 m)   Wt 99 lb 3.2 oz (45 kg)   LMP 02/17/2024   SpO2 94%   BMI 16.51 kg/m    Wt Readings from Last 3 Encounters:  03/05/24 99 lb 3.2 oz (45 kg)  11/10/22 103 lb 3.2 oz (46.8 kg)  06/30/21 101 lb (45.8 kg)    GEN: Well nourished, well developed in no acute distress NECK: No JVD; No carotid bruits CARDIAC: RRR, no murmurs, rubs, gallops RESPIRATORY:  Clear to auscultation without rales, wheezing or rhonchi  ABDOMEN: Soft, non-tender, non-distended EXTREMITIES:  No edema; No deformity  ASSESSMENT AND PLAN: .   Assessment and Plan    Palpitations and exertional shortness of breath Intermittent palpitations and shortness of breath, no chest pain. Normal EKG, thyroid, kidney function, and blood counts. Reduced caffeine intake alleviated symptoms. - Ordered two-week heart monitor to assess for arrhythmias. - Ordered echocardiogram to evaluate cardiac structure and function. - Advised moderation of caffeine intake and adequate hydration with at least eight glasses of water per day. - Advised moderation of alcohol consumption. - Will follow up with results of heart monitor and echocardiogram.                Follow-up: Return if symptoms worsen or fail to improve.  Signed, Kotyk DASEN. Barbaraann, MD, [facility]  [facility]  [address] [place], [place] [address] [phone]  2:50  PM   "

## 2024-03-05 ENCOUNTER — Ambulatory Visit

## 2024-03-05 ENCOUNTER — Ambulatory Visit: Admitting: Cardiovascular Disease

## 2024-03-05 ENCOUNTER — Encounter: Payer: Self-pay | Admitting: Cardiovascular Disease

## 2024-03-05 VITALS — BP 100/62 | HR 81 | Ht 65.0 in | Wt 99.2 lb

## 2024-03-05 DIAGNOSIS — R0602 Shortness of breath: Secondary | ICD-10-CM | POA: Diagnosis not present

## 2024-03-05 DIAGNOSIS — R002 Palpitations: Secondary | ICD-10-CM | POA: Diagnosis not present

## 2024-03-05 NOTE — Progress Notes (Unsigned)
 Enrolled patient for a 14 day Zio XT  monitor to be mailed to patients home

## 2024-03-05 NOTE — Patient Instructions (Signed)
 Medication Instructions:  Your physician recommends that you continue on your current medications as directed. Please refer to the Current Medication list given to you today.  *If you need a refill on your cardiac medications before your next appointment, please call your pharmacy*  Lab Work: NONE If you have labs (blood work) drawn today and your tests are completely normal, you will receive your results only by: MyChart Message (if you have MyChart) OR A paper copy in the mail If you have any lab test that is abnormal or we need to change your treatment, we will call you to review the results.  Testing/Procedures: GEOFFRY HEWS- Long Term Monitor Instructions  Your physician has requested you wear a ZIO patch monitor for __14__ days.   This is a single patch monitor. [employer] supplies one patch monitor per enrollment. Additional  stickers are not available. Please do not apply patch if you will be having a Nuclear Stress Test,  Echocardiogram, Cardiac CT, MRI, or Chest Xray during the period you would be wearing the  monitor. The patch cannot be worn during these tests. You cannot remove and re-apply the  ZIO XT patch monitor.   Your ZIO patch monitor will be mailed 3 day USPS to your address on file. It may take 3-5 days  to receive your monitor after you have been enrolled.   Once you have received your monitor, please review the enclosed instructions. Your monitor  has already been registered assigning a specific monitor serial # to you.     Billing and Patient Assistance Program Information  We have supplied [employer] with any of your insurance information on file for billing purposes.  [employer] offers a sliding scale Patient Assistance Program for patients that do not have  insurance, or whose insurance does not completely cover the cost of the ZIO monitor.  You must apply for the Patient Assistance Program to qualify for this discounted rate.   To apply, please call [employer] at  [phone], select option 4, select option 2, ask to apply for  Patient Assistance Program. Meredeth will ask your household income, and how many people  are in your household. They will quote your out-of-pocket cost based on that information.  [employer] will also be able to set up a 91-month, interest-free payment plan if needed.     Applying the monitor  Shave hair from upper left chest.  Hold abrader disc by orange tab. Rub abrader in 40 strokes over the upper left chest as  indicated in your monitor instructions.  Clean area with 4 enclosed alcohol pads. Let dry.  Apply patch as indicated in monitor instructions. Patch will be placed under collarbone on left  side of chest with arrow pointing upward.  Rub patch adhesive wings for 2 minutes. Remove white label marked 1. Remove the white  label marked 2. Rub patch adhesive wings for 2 additional minutes.  While looking in a mirror, press and release button in center of patch. A small green light will  flash 3-4 times. This will be your only indicator that the monitor has been turned on.  Do not shower for the first 24 hours. You may shower after the first 24 hours.  Press the button if you feel a symptom. You will hear a small click. Record Date, Time and  Symptom in the Patient Logbook.  When you are ready to remove the patch, follow instructions on the last 2 pages of Patient  Logbook. Stick patch monitor onto the last  page of Patient Logbook.   Place Patient Logbook in the blue and white box. Use locking tab on box and tape box closed  securely. The blue and white box has prepaid postage on it. Please place it in the mailbox as  soon as possible. Your physician should have your test results approximately 7 days after the  monitor has been mailed back to [facility].   Call [facility] Customer Care at [phone] if you have questions regarding  your ZIO XT patch monitor. Call them immediately if you see an orange light  blinking on your  monitor.   If your monitor falls off in less than 4 days, contact our Monitor department at [phone].   If your monitor becomes loose or falls off after 4 days call [employer] at [phone] for  suggestions on securing your monitor.     Follow-Up: At [facility] you and your health needs are our priority.  As part of our continuing mission to provide you with exceptional heart care, our providers are all part of one team.  This team includes your primary Cardiologist (physician) and Advanced Practice Providers or APPs (Physician Assistants and Nurse Practitioners) who all work together to provide you with the care you need, when you need it.  Your next appointment:   As Needed. Depending on test results

## 2024-03-06 ENCOUNTER — Ambulatory Visit: Payer: Self-pay | Admitting: Cardiovascular Disease

## 2024-03-06 DIAGNOSIS — I495 Sick sinus syndrome: Secondary | ICD-10-CM

## 2024-03-06 DIAGNOSIS — R000 Tachycardia, unspecified: Secondary | ICD-10-CM

## 2024-03-19 ENCOUNTER — Emergency Department (HOSPITAL_COMMUNITY)
Admission: EM | Admit: 2024-03-19 | Discharge: 2024-03-19 | Disposition: A | Discharge status: Home / Self Care | Attending: Emergency Medicine | Admitting: Emergency Medicine

## 2024-03-19 ENCOUNTER — Other Ambulatory Visit: Payer: Self-pay

## 2024-03-19 DIAGNOSIS — R002 Palpitations: Secondary | ICD-10-CM | POA: Insufficient documentation

## 2024-03-19 DIAGNOSIS — R42 Dizziness and giddiness: Secondary | ICD-10-CM | POA: Insufficient documentation

## 2024-03-19 DIAGNOSIS — R000 Tachycardia, unspecified: Secondary | ICD-10-CM | POA: Insufficient documentation

## 2024-03-19 DIAGNOSIS — R55 Syncope and collapse: Secondary | ICD-10-CM | POA: Diagnosis present

## 2024-03-19 LAB — URINALYSIS, ROUTINE W REFLEX MICROSCOPIC
Bilirubin Urine: NEGATIVE
Glucose, UA: NEGATIVE mg/dL
Hgb urine dipstick: NEGATIVE
Ketones, ur: 5 mg/dL — AB
Leukocytes,Ua: NEGATIVE
Nitrite: NEGATIVE
Protein, ur: NEGATIVE mg/dL
Specific Gravity, Urine: 1.015 (ref 1.005–1.030)
pH: 8 (ref 5.0–8.0)

## 2024-03-19 LAB — COMPREHENSIVE METABOLIC PANEL WITH GFR
ALT: 8 U/L (ref 0–44)
AST: 17 U/L (ref 15–41)
Albumin: 4.4 g/dL (ref 3.5–5.0)
Alkaline Phosphatase: 49 U/L (ref 38–126)
Anion gap: 12 (ref 5–15)
BUN: 11 mg/dL (ref 6–20)
CO2: 25 mmol/L (ref 22–32)
Calcium: 9.3 mg/dL (ref 8.9–10.3)
Chloride: 103 mmol/L (ref 98–111)
Creatinine, Ser: 0.83 mg/dL (ref 0.44–1.00)
GFR, Estimated: >60 mL/min
Glucose, Bld: 94 mg/dL (ref 70–99)
Potassium: 3.9 mmol/L (ref 3.5–5.1)
Sodium: 140 mmol/L (ref 135–145)
Total Bilirubin: 0.7 mg/dL (ref 0.0–1.2)
Total Protein: 7.3 g/dL (ref 6.5–8.1)

## 2024-03-19 LAB — CBG MONITORING, ED: Glucose-Capillary: 115 mg/dL — ABNORMAL HIGH (ref 70–99)

## 2024-03-19 LAB — CBC
HCT: 38.8 % (ref 36.0–46.0)
Hemoglobin: 13.2 g/dL (ref 12.0–15.0)
MCH: 30.2 pg (ref 26.0–34.0)
MCHC: 34 g/dL (ref 30.0–36.0)
MCV: 88.8 fL (ref 80.0–100.0)
Platelets: 262 K/uL (ref 150–400)
RBC: 4.37 MIL/uL (ref 3.87–5.11)
RDW: 12.4 % (ref 11.5–15.5)
WBC: 10 K/uL (ref 4.0–10.5)
nRBC: 0 % (ref 0.0–0.2)

## 2024-03-19 LAB — HCG, SERUM, QUALITATIVE: Preg, Serum: NEGATIVE

## 2024-03-19 LAB — D-DIMER, QUANTITATIVE: D-Dimer, Quant: 0.44 ug{FEU}/mL (ref 0.00–0.50)

## 2024-03-19 MED ORDER — SODIUM CHLORIDE 0.9 % IV BOLUS
1000.0000 mL | Freq: Once | INTRAVENOUS | Status: AC
  Administered 2024-03-19: 1000 mL via INTRAVENOUS

## 2024-03-19 NOTE — Discharge Instructions (Signed)
 Fortunately no concerning findings were noted on today's exam.  Please follow-up closely with your cardiologist for outpatient management of your condition.

## 2024-03-19 NOTE — ED Triage Notes (Signed)
 Patient arrives via [facility] from work for dizziness which started two hours ago. Patient endorses no pain. Alert oriented x4. Sinus tach HR 102. 125/70, RR 17, 98 on room air. Patient with halter monitor on until Saturday. Patient saw cardiology for palpitations but is not currently taking any meds.

## 2024-03-19 NOTE — ED Provider Notes (Signed)
 " [facility] AT [facility] Provider Note   CSN: [id number] Arrival date & time: 03/19/24  1531     Patient presents with: Dizziness   Katherine Estrada is a 29 y.o. female.   The history is provided by the patient, the EMS personnel and medical records. No language interpreter was used.  Dizziness   29 year old female brought here via EMS from work for evaluation of dizziness.  Patient report while at work today she felt dizzy.  She described as a room spinning sensation that lasting for approximately 10 minutes.  She also felt her heart palpitate.  She was sitting at work while this happened.  Symptom has since resolved.  She mentions she has had similar episode like this last week as well.  She was seen by a cardiologist for the same symptoms approximately 2 weeks ago.  States that blood work was done and she is currently wearing a Holter monitoring for 2 weeks.  She is also scheduled to have a cardiac ultrasound afterward.  Otherwise patient denies having any fever chills no headache no chest pain no productive cough no change in her dietary intake and no increasing energy drinks.  He denies any prior history of PE or DVT no recent surgery prolonged bedrest active cancer hemoptysis she is not taking any oral hormone.  She denies any drug use.  Last menstrual period was a month ago.  Prior to Admission medications  Not on File    Allergies: Patient has no known allergies.    Review of Systems  Neurological:  Positive for dizziness.  All other systems reviewed and are negative.   Updated Vital Signs BP 108/87   Pulse (!) 116   Temp 98.8 F (37.1 C) (Oral)   Resp 18   Ht 5' 5 (1.651 m)   Wt 45.4 kg   LMP 02/17/2024   SpO2 100%   BMI 16.64 kg/m   Physical Exam Vitals and nursing note reviewed.  Constitutional:      General: She is not in acute distress.    Appearance: She is well-developed.  HENT:     Head: Atraumatic.  Eyes:      Conjunctiva/sclera: Conjunctivae normal.  Neck:     Comments: No thyromegaly Cardiovascular:     Rate and Rhythm: Tachycardia present.     Pulses: Normal pulses.     Heart sounds: Normal heart sounds.  Pulmonary:     Effort: Pulmonary effort is normal.     Breath sounds: No wheezing, rhonchi or rales.  Abdominal:     Palpations: Abdomen is soft.     Tenderness: There is no abdominal tenderness.  Musculoskeletal:        General: Normal range of motion.     Cervical back: Normal range of motion and neck supple.     Right lower leg: No edema.     Left lower leg: No edema.  Skin:    Findings: No rash.  Neurological:     Mental Status: She is alert. Mental status is at baseline.  Psychiatric:        Mood and Affect: Mood normal.     (all labs ordered are listed, but only abnormal results are displayed) Labs Reviewed  URINALYSIS, ROUTINE W REFLEX MICROSCOPIC - Abnormal; Notable for the following components:      Result Value   APPearance TURBID (*)    Ketones, ur 5 (*)    Bacteria, UA FEW (*)    All other  components within normal limits  CBG MONITORING, ED - Abnormal; Notable for the following components:   Glucose-Capillary 115 (*)    All other components within normal limits  COMPREHENSIVE METABOLIC PANEL WITH GFR  CBC  HCG, SERUM, QUALITATIVE  D-DIMER, QUANTITATIVE    EKG: EKG Interpretation Date/Time:  Wednesday March 19 2024 15:41:52 EST Ventricular Rate:  98 PR Interval:  160 QRS Duration:  76 QT Interval:  316 QTC Calculation: 403 R Axis:   -20  Text Interpretation: Normal sinus rhythm with sinus arrhythmia Cannot rule out Anterior infarct , age undetermined  no acute ST/T changes Confirmed by Freddi Hamilton [phone]) on 03/19/2024 4:00:18 PM  Radiology: No results found.   Procedures   Medications Ordered in the ED  sodium chloride  0.9 % bolus 1,000 mL (0 mLs Intravenous Stopped 03/19/24 1847)                                    Medical Decision  Making Amount and/or Complexity of Data Reviewed Labs: ordered.   BP 108/87   Pulse (!) 116   Temp 98.8 F (37.1 C) (Oral)   Resp 18   Ht 5' 5 (1.651 m)   Wt 45.4 kg   LMP 02/17/2024   SpO2 100%   BMI 16.64 kg/m   36:50 PM  29 year old female brought here via EMS from work for evaluation of dizziness.  Patient report while at work today she felt dizzy.  She described as a room spinning sensation that lasting for approximately 10 minutes.  She also felt her heart palpitate.  She was sitting at work while this happened.  Symptom has since resolved.  She mentions she has had similar episode like this last week as well.  She was seen by a cardiologist for the same symptoms approximately 2 weeks ago.  States that blood work was done and she is currently wearing a Holter monitoring for 2 weeks.  She is also scheduled to have a cardiac ultrasound afterward.  Otherwise patient denies having any fever chills no headache no chest pain no productive cough no change in her dietary intake and no increasing energy drinks.  He denies any prior history of PE or DVT no recent surgery prolonged bedrest active cancer hemoptysis she is not taking any oral hormone.  She denies any drug use.  Last menstrual period was a month ago.  On exam patient is resting comfortably in no acute discomfort.  Mild tachycardia but otherwise lungs are clear no evidence of fluid overload, no thyromegaly, and patient is mentating appropriately.  Vitals are notable for heart rate of 116.  On repeat, heart rate normalized  -Labs ordered, independently viewed and interpreted by me.  Labs remarkable for negative D-dimer, low suspicion for PE.  Labs overall reassuring, no metabolic derangement, normal BBC, normal H&H, pregnancy test is negative -The patient was maintained on a cardiac monitor.  I personally viewed and interpreted the cardiac monitored which showed an underlying rhythm of: Sinus tachycardia -Imaging including chest CTA  considered but with negative D-dimer felt low yield for PE -This patient presents to the ED for concern of dizziness, this involves an extensive number of treatment options, and is a complaint that carries with it a high risk of complications and morbidity.  The differential diagnosis includes POTS, cardiac arrhythmia, anemia, metabolic derangement, PE, dehydration, autonomic dysregulation -Co morbidities that complicate the patient evaluation includes none -Treatment includes  IV fluid -Reevaluation of the patient after these medicines showed that the patient stayed the same -PCP office notes or outside notes reviewed -Escalation to admission/observation considered: patients feels much better, is comfortable with discharge, and will follow up with PCP -Prescription medication considered, patient comfortable with home medication -Social Determinant of Health considered          Final diagnoses:  Near syncope    ED Discharge Orders     None          Nivia Colon, PA-C 03/19/24 1951  "

## 2024-04-02 DIAGNOSIS — R0602 Shortness of breath: Secondary | ICD-10-CM

## 2024-04-02 DIAGNOSIS — R002 Palpitations: Secondary | ICD-10-CM | POA: Diagnosis not present

## 2024-04-03 ENCOUNTER — Telehealth: Payer: Self-pay | Admitting: Cardiovascular Disease

## 2024-04-03 NOTE — Telephone Encounter (Signed)
 Pt requesting a call back to go over heart monitor results. Please advise.

## 2024-04-04 NOTE — Telephone Encounter (Signed)
 Results have been sent to pt via mychart per Dr. Barbaraann. No further questions at this time.

## 2024-05-07 ENCOUNTER — Ambulatory Visit (HOSPITAL_COMMUNITY)
